# Patient Record
Sex: Female | Born: 1970 | Race: Black or African American | Hispanic: No
Health system: Southern US, Community
[De-identification: ages and names within clinical notes are randomized; demographics above are authoritative.]

## PROBLEM LIST (undated history)

## (undated) DIAGNOSIS — E785 Hyperlipidemia, unspecified: Secondary | ICD-10-CM

## (undated) DIAGNOSIS — Z8249 Family history of ischemic heart disease and other diseases of the circulatory system: Secondary | ICD-10-CM

## (undated) DIAGNOSIS — I10 Essential (primary) hypertension: Secondary | ICD-10-CM

## (undated) DIAGNOSIS — E7800 Pure hypercholesterolemia, unspecified: Secondary | ICD-10-CM

## (undated) DIAGNOSIS — G4733 Obstructive sleep apnea (adult) (pediatric): Secondary | ICD-10-CM

## (undated) HISTORY — DX: Pure hypercholesterolemia, unspecified: E78.00

## (undated) HISTORY — PX: CARPAL TUNNEL RELEASE: SHX101

## (undated) HISTORY — PX: OTHER SURGICAL HISTORY: SHX169

## (undated) HISTORY — DX: Hyperlipidemia, unspecified: E78.5

## (undated) HISTORY — DX: Obstructive sleep apnea (adult) (pediatric): G47.33

## (undated) HISTORY — DX: Essential (primary) hypertension: I10

## (undated) HISTORY — PX: ABDOMINAL HYSTERECTOMY: SHX81

## (undated) HISTORY — DX: Family history of ischemic heart disease and other diseases of the circulatory system: Z82.49

---

## 1997-10-22 ENCOUNTER — Encounter (INDEPENDENT_AMBULATORY_CARE_PROVIDER_SITE_OTHER): Payer: Self-pay | Admitting: *Deleted

## 1997-10-22 LAB — CONVERTED CEMR LAB

## 1999-10-09 ENCOUNTER — Encounter: Admission: RE | Admit: 1999-10-09 | Discharge: 1999-10-09 | Payer: Self-pay | Admitting: Family Medicine

## 1999-10-18 ENCOUNTER — Encounter: Admission: RE | Admit: 1999-10-18 | Discharge: 1999-10-18 | Payer: Self-pay | Admitting: Family Medicine

## 2000-03-06 ENCOUNTER — Encounter: Admission: RE | Admit: 2000-03-06 | Discharge: 2000-03-06 | Payer: Self-pay | Admitting: Family Medicine

## 2002-02-11 ENCOUNTER — Ambulatory Visit (HOSPITAL_COMMUNITY): Admission: RE | Admit: 2002-02-11 | Discharge: 2002-02-11 | Payer: Self-pay | Admitting: Family Medicine

## 2002-02-11 ENCOUNTER — Encounter: Payer: Self-pay | Admitting: Family Medicine

## 2002-02-16 ENCOUNTER — Encounter: Payer: Self-pay | Admitting: Family Medicine

## 2002-02-16 ENCOUNTER — Ambulatory Visit (HOSPITAL_COMMUNITY): Admission: RE | Admit: 2002-02-16 | Discharge: 2002-02-16 | Payer: Self-pay | Admitting: Family Medicine

## 2002-02-18 ENCOUNTER — Other Ambulatory Visit: Admission: RE | Admit: 2002-02-18 | Discharge: 2002-02-18 | Payer: Self-pay | Admitting: Obstetrics and Gynecology

## 2002-03-24 ENCOUNTER — Encounter: Admission: RE | Admit: 2002-03-24 | Discharge: 2002-03-24 | Payer: Self-pay | Admitting: Obstetrics and Gynecology

## 2002-03-24 ENCOUNTER — Encounter: Payer: Self-pay | Admitting: Obstetrics and Gynecology

## 2002-03-25 ENCOUNTER — Inpatient Hospital Stay (HOSPITAL_COMMUNITY): Admission: RE | Admit: 2002-03-25 | Discharge: 2002-03-27 | Payer: Self-pay | Admitting: Obstetrics and Gynecology

## 2003-04-02 ENCOUNTER — Other Ambulatory Visit: Admission: RE | Admit: 2003-04-02 | Discharge: 2003-04-02 | Payer: Self-pay | Admitting: Obstetrics and Gynecology

## 2003-04-27 ENCOUNTER — Ambulatory Visit (HOSPITAL_COMMUNITY): Admission: RE | Admit: 2003-04-27 | Discharge: 2003-04-27 | Payer: Self-pay | Admitting: Obstetrics and Gynecology

## 2003-09-23 ENCOUNTER — Other Ambulatory Visit: Admission: RE | Admit: 2003-09-23 | Discharge: 2003-09-23 | Payer: Self-pay | Admitting: Obstetrics and Gynecology

## 2003-12-22 ENCOUNTER — Ambulatory Visit (HOSPITAL_COMMUNITY): Admission: RE | Admit: 2003-12-22 | Discharge: 2003-12-22 | Payer: Self-pay | Admitting: Orthopedic Surgery

## 2004-04-10 ENCOUNTER — Other Ambulatory Visit: Admission: RE | Admit: 2004-04-10 | Discharge: 2004-04-10 | Payer: Self-pay | Admitting: Obstetrics and Gynecology

## 2005-12-04 ENCOUNTER — Other Ambulatory Visit: Admission: RE | Admit: 2005-12-04 | Discharge: 2005-12-04 | Payer: Self-pay | Admitting: Obstetrics and Gynecology

## 2006-11-26 ENCOUNTER — Other Ambulatory Visit: Admission: RE | Admit: 2006-11-26 | Discharge: 2006-11-26 | Payer: Self-pay | Admitting: Family Medicine

## 2006-12-20 ENCOUNTER — Encounter (INDEPENDENT_AMBULATORY_CARE_PROVIDER_SITE_OTHER): Payer: Self-pay | Admitting: *Deleted

## 2012-02-18 ENCOUNTER — Other Ambulatory Visit: Payer: Self-pay | Admitting: Obstetrics and Gynecology

## 2012-02-18 DIAGNOSIS — Z1231 Encounter for screening mammogram for malignant neoplasm of breast: Secondary | ICD-10-CM

## 2012-03-21 ENCOUNTER — Ambulatory Visit: Payer: Self-pay

## 2014-04-08 ENCOUNTER — Other Ambulatory Visit (HOSPITAL_COMMUNITY): Payer: Self-pay | Admitting: Obstetrics and Gynecology

## 2014-04-08 DIAGNOSIS — E041 Nontoxic single thyroid nodule: Secondary | ICD-10-CM

## 2014-04-13 ENCOUNTER — Ambulatory Visit (HOSPITAL_COMMUNITY): Payer: PRIVATE HEALTH INSURANCE

## 2015-04-11 ENCOUNTER — Other Ambulatory Visit: Payer: Self-pay | Admitting: Obstetrics and Gynecology

## 2015-04-12 LAB — CYTOLOGY - PAP

## 2019-06-09 ENCOUNTER — Other Ambulatory Visit: Payer: Self-pay | Admitting: Obstetrics and Gynecology

## 2019-06-09 DIAGNOSIS — Z9189 Other specified personal risk factors, not elsewhere classified: Secondary | ICD-10-CM

## 2019-07-11 ENCOUNTER — Other Ambulatory Visit: Payer: PRIVATE HEALTH INSURANCE

## 2021-05-22 ENCOUNTER — Other Ambulatory Visit: Payer: Self-pay | Admitting: Obstetrics and Gynecology

## 2021-05-22 DIAGNOSIS — R928 Other abnormal and inconclusive findings on diagnostic imaging of breast: Secondary | ICD-10-CM

## 2021-06-08 ENCOUNTER — Other Ambulatory Visit: Payer: Self-pay

## 2021-06-08 ENCOUNTER — Ambulatory Visit: Payer: PRIVATE HEALTH INSURANCE

## 2021-06-08 ENCOUNTER — Ambulatory Visit
Admission: RE | Admit: 2021-06-08 | Discharge: 2021-06-08 | Disposition: A | Discharge status: Home / Self Care | Payer: BLUE CROSS/BLUE SHIELD | Source: Ambulatory Visit | Attending: Obstetrics and Gynecology | Admitting: Obstetrics and Gynecology

## 2021-06-08 DIAGNOSIS — R928 Other abnormal and inconclusive findings on diagnostic imaging of breast: Secondary | ICD-10-CM

## 2021-09-27 ENCOUNTER — Other Ambulatory Visit: Payer: Self-pay | Admitting: Obstetrics and Gynecology

## 2021-09-27 DIAGNOSIS — Z139 Encounter for screening, unspecified: Secondary | ICD-10-CM

## 2021-10-25 ENCOUNTER — Other Ambulatory Visit: Payer: BLUE CROSS/BLUE SHIELD

## 2021-11-14 ENCOUNTER — Ambulatory Visit
Admission: RE | Admit: 2021-11-14 | Discharge: 2021-11-14 | Disposition: A | Discharge status: Home / Self Care | Payer: No Typology Code available for payment source | Source: Ambulatory Visit | Attending: Obstetrics and Gynecology | Admitting: Obstetrics and Gynecology

## 2021-11-14 DIAGNOSIS — Z139 Encounter for screening, unspecified: Secondary | ICD-10-CM

## 2021-11-17 ENCOUNTER — Telehealth: Payer: Self-pay

## 2021-11-17 NOTE — Telephone Encounter (Signed)
NOTES SCANNED TO REFERRAL 

## 2021-12-12 ENCOUNTER — Ambulatory Visit: Payer: BLUE CROSS/BLUE SHIELD | Admitting: Cardiovascular Disease

## 2022-10-16 NOTE — Progress Notes (Signed)
Cardiology Office Note:    Date:  01/02/2023   ID:  Debra Bridges, DOB 26-Aug-1971, MRN [id number]  PCP:  Arvella Nigh, MD   [facility] Providers Cardiologist:  Lenna Sciara, MD Referring MD: Arvella Nigh, MD   Chief Complaint/Reason for Referral: Elevated coronary calcium score  PATIENT DID NOT APPEAR FOR APPOINTMENT   ASSESSMENT:    1. Coronary artery calcification seen on CAT scan   2. Prediabetes   3. Hypertension associated with diabetes ([place])   4. Hyperlipidemia associated with type 2 diabetes mellitus ([place])   5. Body mass index (BMI) of 40.0 to 44.9 in adult [facility])     PLAN:    In order of problems listed above: 1.  Elevated coronary calcification: Start aspirin 81 mg and continue rosuvastatin. 2.  Prediabetes: Will check hemoglobin A1c today.  Start aspirin 81 mg, continue lisinopril and rosuvastatin.  If A1c is diagnostic for diabetes consider Jardiance. 3.  Hypertension: Continue lisinopril. 4.  Hyperlipidemia: Will check lipid panel, lipid, and LFTs today. 5.  Elevated BMI: Refer to pharmacy for recommendations.          History of Present Illness:    FOCUSED PROBLEM LIST:   1.  Elevated coronary artery calcium score on CT 2023 2.  Prediabetes on metformin 3.  Hypertension 4.  Hyperlipidemia 5.  BMI of 40 6.  CKD stage II  The patient is a 51 y.o. female with the indicated medical history here for recommendations regarding elevated calcium score on screening CT.          Current Medications: No outpatient medications have been marked as taking for the 10/17/22 encounter (Office Visit) with Early Osmond, MD.     Allergies:    Patient has no known allergies.   Social History:       Family Hx: Family History  Problem Relation Age of Onset   Hypertension Mother    Stroke Mother    Arthritis Mother    Atrial fibrillation Mother    Diabetes Mother    Cancer Paternal Grandmother        BREAST   Breast cancer Paternal  Grandmother      Review of Systems:   Please see the history of present illness.    All other systems reviewed and are negative.     EKGs/Labs/Other Test Reviewed:    EKG:  EKG performed today that I personally reviewed demonstrates  Prior CV studies:  Calcium score CT 2023: 1. Coronary calcium score is 4.43 and this is at percentile 85 for patients of the same age, gender and ethnicity. 2. Multiple small nodular lung densities, predominantly pleural based. No follow-up needed if patient is low-risk (and has no known or suspected primary neoplasm). Non-contrast chest CT can be considered in 12 months if patient is high-risk. This recommendation follows the consensus statement: Guidelines for Management of Incidental Pulmonary Nodules Detected on CT Images: From the Fleischner Society 2017; Radiology 2017; 284:228-243.  Other studies Reviewed: Review of the additional studies/records demonstrates: No other imaging evidence available of aortic atherosclerosis  Recent Labs: No results found for requested labs within last 365 days.   Recent Lipid Panel Lab Results  Component Value Date/Time   CHOL 163 10/31/2022 03:15 PM   TRIG 125 10/31/2022 03:15 PM   HDL 66 10/31/2022 03:15 PM   [place] 75 10/31/2022 03:15 PM    Risk Assessment/Calculations:     Physical Exam:      Signed, Early Osmond, MD  01/02/2023 2:29 PM    [facility] [place], [place], [place]  [address] Phone: [phone]; Fax: [phone]   Note:  This document was prepared using Dragon voice recognition software and may include unintentional dictation errors.

## 2022-10-17 ENCOUNTER — Ambulatory Visit: Payer: BLUE CROSS/BLUE SHIELD | Admitting: Internal Medicine

## 2022-10-31 ENCOUNTER — Ambulatory Visit: Payer: BC Managed Care – PPO | Attending: Internal Medicine | Admitting: Cardiology

## 2022-10-31 VITALS — BP 118/80 | HR 109 | Ht 64.0 in | Wt 239.6 lb

## 2022-10-31 DIAGNOSIS — I10 Essential (primary) hypertension: Secondary | ICD-10-CM

## 2022-10-31 DIAGNOSIS — R918 Other nonspecific abnormal finding of lung field: Secondary | ICD-10-CM | POA: Diagnosis not present

## 2022-10-31 DIAGNOSIS — R0683 Snoring: Secondary | ICD-10-CM

## 2022-10-31 DIAGNOSIS — E785 Hyperlipidemia, unspecified: Secondary | ICD-10-CM

## 2022-10-31 DIAGNOSIS — I2510 Atherosclerotic heart disease of native coronary artery without angina pectoris: Secondary | ICD-10-CM | POA: Diagnosis not present

## 2022-10-31 NOTE — Progress Notes (Signed)
Cardiology Office Note:    Date:  10/31/2022   ID:  Debra, Bridges 06-Nov-1970, MRN [id number]  PCP:  Arvella Nigh, MD  Cardiologist:  None  Electrophysiologist:  None   Referring MD: Arvella Nigh, MD   Chief Complaint  Patient presents with   Coronary Artery Disease    History of Present Illness:    Debra Bridges is a 52 y.o. female with a hx of hypertension, hyperlipidemia who is referred by Dr. Radene Knee for evaluation of elevated calcium score.  Calcium score 4 on 11/14/2021 (85th percentile).  Also with multiple small lung nodules.  She denies any chest pain, dyspnea, lightheadedness, syncope, lower extremity edema.  Reports palpitations rarely that lasts for few seconds. Walks 3 days per week for 15 minutes.  No exertional symptoms.  No smoking history.  Family history includes mother has A-fib.   Past Medical History:  Diagnosis Date   Elevated LDL cholesterol level    Family history of cardiovascular disease    Hyperlipidemia    Hypertension     Past Surgical History:  Procedure Laterality Date   ABDOMINAL HYSTERECTOMY     CARPAL TUNNEL RELEASE     EXCISION OF SEBACEOUS CYST      Current Medications: Current Meds  Medication Sig   betamethasone valerate (VALISONE) 0.1 % cream Apply 1 application topically daily. APPLY A THIN LAYER TO THE AFFECTED AREA ONCE DAILY   fluticasone (FLONASE) 50 MCG/ACT nasal spray Place into both nostrils daily.   hydrochlorothiazide (MICROZIDE) 12.5 MG capsule Take 12.5 mg by mouth daily.   lisinopril (ZESTRIL) 10 MG tablet Take 10 mg by mouth daily.   meloxicam (MOBIC) 7.5 MG tablet Take 1 tablet by mouth daily.   metFORMIN (GLUCOPHAGE) 500 MG tablet Take 1 tablet by mouth 2 (two) times daily with a meal.   Omega-3 Fatty Acids (FISH OIL PO) Take by mouth.   rosuvastatin (CRESTOR) 5 MG tablet      Allergies:   Patient has no known allergies.   Social History   Socioeconomic History   Marital status: Soil scientist     Spouse name: Not on file   Number of children: Not on file   Years of education: Not on file   Highest education level: Not on file  Occupational History   Not on file  Tobacco Use   Smoking status: Not on file   Smokeless tobacco: Not on file  Substance and Sexual Activity   Alcohol use: Not on file   Drug use: Not on file   Sexual activity: Not on file  Other Topics Concern   Not on file  Social History Narrative   Not on file   Social Determinants of Health   Financial Resource Strain: Not on file  Food Insecurity: Not on file  Transportation Needs: Not on file  Physical Activity: Not on file  Stress: Not on file  Social Connections: Not on file     Family History: The patient's family history includes Arthritis in her mother; Atrial fibrillation in her mother; Breast cancer in her paternal grandmother; Cancer in her paternal grandmother; Diabetes in her mother; Hypertension in her mother; Stroke in her mother.  ROS:   Please see the history of present illness.     All other systems reviewed and are negative.  EKGs/Labs/Other Studies Reviewed:    The following studies were reviewed today:   EKG:   10/31/2022: Sinus tachycardia, rate 109, no ST abnormalities  Recent Labs: No  results found for requested labs within last 365 days.  Recent Lipid Panel No results found for: "CHOL", "TRIG", "HDL", "CHOLHDL", "VLDL", "LDLCALC", "LDLDIRECT"  Physical Exam:    VS:  BP 118/80   Pulse (!) 109   Ht 5\' 4"  (1.626 m)   Wt 239 lb 9.6 oz (108.7 kg)   SpO2 99%   BMI 41.13 kg/m     Wt Readings from Last 3 Encounters:  10/31/22 239 lb 9.6 oz (108.7 kg)     GEN:  Well nourished, well developed in no acute distress HEENT: Normal NECK: No JVD; No carotid bruits LYMPHATICS: No lymphadenopathy CARDIAC: RRR, no murmurs, rubs, gallops RESPIRATORY:  Clear to auscultation without rales, wheezing or rhonchi  ABDOMEN: Soft, non-tender, non-distended MUSCULOSKELETAL:  No edema;  No deformity  SKIN: Warm and dry NEUROLOGIC:  Alert and oriented x 3 PSYCHIATRIC:  Normal affect   ASSESSMENT:    1. Coronary artery calcification seen on CAT scan   2. Essential hypertension   3. Hyperlipidemia, unspecified hyperlipidemia type   4. Lung nodules    PLAN:    CAD: Calcium score 4 on 11/14/2021 (85th percentile).  No anginal symptoms -Currently on rosuvastatin 5 mg daily.  Check lipid panel  Hypertension: On lisinopril 10 mg daily and HCTZ 12.5 mg daily.  Appears controlled  Small lung nodules: Noted on calcium score.  Patient is low risk (no smoking history or history of malignancy), no follow-up needed  Hyperlipidemia: LDL 132 on 10/24/2021.  Started on rosuvastatin 5 mg daily.  Unable to tolerate higher doses of statin due to myalgias.  Check lipid panel  T2DM: A1c 6.8% on 11/21/21.  On metformin.  RTC in 6 months   Medication Adjustments/Labs and Tests Ordered: Current medicines are reviewed at length with the patient today.  Concerns regarding medicines are outlined above.  No orders of the defined types were placed in this encounter.  No orders of the defined types were placed in this encounter.   There are no Patient Instructions on file for this visit.   Signed, Donato Heinz, MD  10/31/2022 2:52 PM    [facility]

## 2022-10-31 NOTE — Patient Instructions (Signed)
Medication Instructions:  Your physician recommends that you continue on your current medications as directed. Please refer to the Current Medication list given to you today.  *If you need a refill on your cardiac medications before your next appointment, please call your pharmacy*   Lab Work: Lipid today  If you have labs (blood work) drawn today and your tests are completely normal, you will receive your results only by: [place] (if you have MyChart) OR A paper copy in the mail If you have any lab test that is abnormal or we need to change your treatment, we will call you to review the results.   Testing/Procedures: WatchPAT?  Is a FDA cleared portable home sleep study test that uses a watch and 3 points of contact to monitor 7 different channels, including your heart rate, oxygen saturations, body position, snoring, and chest motion.  The study is easy to use from the comfort of your own home and accurately detect sleep apnea.  Before bed, you attach the chest sensor, attached the sleep apnea bracelet to your nondominant hand, and attach the finger probe.  After the study, the raw data is downloaded from the watch and scored for apnea events.   For more information: [email or web address]  Patient Testing Instructions:  Do not put battery into the device until bedtime when you are ready to begin the test. Please call the support number if you need assistance after following the instructions below: 24 hour support line- [phone] or ITAMAR support at [phone] (option 2)  Download the [employer] AmericanWatchPAT One" app through the google play store or App Store  Be sure to turn on or enable access to bluetooth in [place] on your smartphone/ device  Make sure no other bluetooth devices are on and within the vicinity of your smartphone/ device and WatchPAT watch during testing.  Make sure to leave your smart phone/ device plugged in and charging all night.  When  ready for bed:  Follow the instructions step by step in the WatchPAT One App to activate the testing device. For additional instructions, including video instruction, visit the WatchPAT One video on Youtube. You can search for [place] One within Youtube (video is 4 minutes and 18 seconds) or enter: https://youtube/watch?v=BCce_vbiwxE Please note: You will be prompted to enter a Pin to connect via bluetooth when starting the test. The PIN will be assigned to you when you receive the test.  The device is disposable, but it recommended that you retain the device until you receive a call letting you know the study has been received and the results have been interpreted.  We will let you know if the study did not transmit to [place] properly after the test is completed. You do not need to call us to confirm the receipt of the test.  Please complete the test within 48 hours of receiving PIN.   Frequently Asked Questions:  What is Watch [place] one?  A single use fully disposable home sleep apnea testing device and will not need to be returned after completion.  What are the requirements to use WatchPAT one?  The be able to have a successful watchpat one sleep study, you should have your Watch pat one device, your smart phone, watch pat one app, your PIN number and Internet access What type of phone do I need?  You should have a smart phone that uses Android 5.1 and above or any Iphone with IOS 10 and above How can I download the WatchPAT one app?  Based on your device type search for WatchPAT one app either in google play for android devices or APP store for Iphone's Where will I get my PIN for the study?  Your PIN will be provided by your physician's office. It is used for authentication and if you lose/forget your PIN, please reach out to your providers office.  I do not have Internet at home. Can I do WatchPAT one study?  WatchPAT One needs Internet connection throughout the night to be able to transmit the  sleep data. You can use your home/local internet or your cellular's data package. However, it is always recommended to use home/local Internet. It is estimated that between 20MB-30MB will be used with each study.However, the application will be looking for 80MB space in the phone to start the study.  What happens if I lose internet or bluetooth connection?  During the internet disconnection, your phone will not be able to transmit the sleep data. All the data, will be stored in your phone. As soon as the internet connection is back on, the phone will being sending the sleep data. During the bluetooth disconnection, WatchPAT one will not be able to to send the sleep data to your phone. Data will be kept in the [facility] one until two devices have bluetooth connection back on. As soon as the connection is back on, WatchPAT one will send the sleep data to the phone.  How long do I need to wear the WatchPAT one?  After you start the study, you should wear the device at least 6 hours.  How far should I keep my phone from the device?  During the night, your phone should be within 15 feet.  What happens if I leave the room for restroom or other reasons?  Leaving the room for any reason will not cause any problem. As soon as your get back to the room, both devices will reconnect and will continue to send the sleep data. Can I use my phone during the sleep study?  Yes, you can use your phone as usual during the study. But it is recommended to put your watchpat one on when you are ready to go to bed.  How will I get my study results?  A soon as you completed your study, your sleep data will be sent to the provider. They will then share the results with you when they are ready.    Follow-Up: At [facility], you and your health needs are our priority.  As part of our continuing mission to provide you with exceptional heart care, we have created designated Provider Care Teams.  These Care Teams include  your primary Cardiologist (physician) and Advanced Practice Providers (APPs -  Physician Assistants and Nurse Practitioners) who all work together to provide you with the care you need, when you need it.  We recommend signing up for the patient portal called "MyChart".  Sign up information is provided on this After Visit Summary.  MyChart is used to connect with patients for Virtual Visits (Telemedicine).  Patients are able to view lab/test results, encounter notes, upcoming appointments, etc.  Non-urgent messages can be sent to your provider as well.   To learn more about what you can do with MyChart, go to [email or web address].    Your next appointment:   6 month(s)  The format for your next appointment:   In Person  Provider:   Dr. Gardiner Rhyme

## 2022-11-01 LAB — LIPID PANEL
Chol/HDL Ratio: 2.5 ratio (ref 0.0–4.4)
Cholesterol, Total: 163 mg/dL (ref 100–199)
HDL: 66 mg/dL (ref >39)
LDL Chol Calc (NIH): 75 mg/dL (ref 0–99)
Triglycerides: 125 mg/dL (ref 0–149)
VLDL Cholesterol Cal: 22 mg/dL (ref 5–40)

## 2022-11-02 ENCOUNTER — Other Ambulatory Visit: Payer: Self-pay | Admitting: *Deleted

## 2022-11-02 MED ORDER — EZETIMIBE 10 MG PO TABS: 10.0000 mg | ORAL_TABLET | Freq: Every day | ORAL | 3 refills | Status: DC

## 2022-11-08 ENCOUNTER — Telehealth: Payer: Self-pay | Admitting: *Deleted

## 2022-11-08 ENCOUNTER — Encounter (INDEPENDENT_AMBULATORY_CARE_PROVIDER_SITE_OTHER): Payer: BC Managed Care – PPO | Admitting: Cardiology

## 2022-11-08 ENCOUNTER — Telehealth: Payer: Self-pay

## 2022-11-08 DIAGNOSIS — G4733 Obstructive sleep apnea (adult) (pediatric): Secondary | ICD-10-CM | POA: Diagnosis not present

## 2022-11-08 NOTE — Telephone Encounter (Signed)
Per Electa Sniff C at [facility] no PA is required for itamar. Stacy notified ok to have patient to activate the device.

## 2022-11-08 NOTE — Telephone Encounter (Signed)
Called and made the patient aware that SHE may proceed with the Itamar Home Sleep Study. PIN # provided to the patient. Patient made aware that SHE will be contacted after the test has been read with the results and any recommendations. Patient verbalized understanding and thanked me for the call.   

## 2022-11-09 NOTE — Progress Notes (Signed)
Patient came to office to return her itamar home sleep device. When she completed the study this morning, she stated the app told her that she needed to return device or print a label.  Informed her that we let her know if there are issues with how it transmitted to the cloud. She was appreciative for the update.

## 2022-11-11 NOTE — Procedures (Signed)
   Patient Information Study Date: 11/08/2022 Patient Name: Debra Bridges Patient ID: [id number] Birth Date: 1971-02-06 Age: 52 Gender: Female BMI: 41.0 (W=240 lb, H=5' 4'') Referring Physician: Oswaldo Milian, MD  TEST DESCRIPTION: Home sleep apnea testing was completed using the WatchPat, a Type 1 device, utilizing peripheral arterial tonometry (PAT), chest movement, actigraphy, pulse oximetry, pulse rate, body position and snore. AHI was calculated with apnea and hypopnea using valid sleep time as the denominator. RDI includes apneas, hypopneas, and RERAs. The data acquired and the scoring of sleep and all associated events were performed in accordance with the recommended standards and specifications as outlined in the AASM Manual for the Scoring of Sleep and Associated Events 2.2.0 (2015).   FINDINGS:   1. Mild Obstructive Sleep Apnea with AHI 8.4/hr.   2. No Central Sleep Apnea with pAHIc 0.4/hr.   3. Oxygen desaturations as low as 84%.   4. Mild snoring was present. O2 sats were < 88% for 0.3 min.   5. Total sleep time was 8 hrs and 20 min.   6. 23.3% of total sleep time was spent in REM sleep.   7. Normal sleep onset latency at 16 min.   8. Shortened REM sleep onset latency at 40 min.   9. Total awakenings were 10.  10. Arrhythmia detection:  None.  DIAGNOSIS: Mild Obstructive Sleep Apnea (G47.33)  RECOMMENDATIONS:   1.  Clinical correlation of these findings is necessary.  The decision to treat obstructive sleep apnea (OSA) is usually based on the presence of apnea symptoms or the presence of associated medical conditions such as Hypertension, Congestive Heart Failure, Atrial Fibrillation or Obesity.  The most common symptoms of OSA are snoring, gasping for breath while sleeping, daytime sleepiness and fatigue.   2.  Initiating apnea therapy is recommended given the presence of symptoms and/or associated conditions. Recommend proceeding with one of the following:      a.  Auto-CPAP therapy with a pressure range of 5-20cm H2O.     b.  An oral appliance (OA) that can be obtained from certain dentists with expertise in sleep medicine.  These are primarily of use in non-obese patients with mild and moderate disease.     c.  An ENT consultation which may be useful to look for specific causes of obstruction and possible treatment options.     d.  If patient is intolerant to PAP therapy, consider referral to ENT for evaluation for hypoglossal nerve stimulator.   3.  Close follow-up is necessary to ensure success with CPAP or oral appliance therapy for maximum benefit.  4.  A follow-up oximetry study on CPAP is recommended to assess the adequacy of therapy and determine the need for supplemental oxygen or the potential need for Bi-level therapy.  An arterial blood gas to determine the adequacy of baseline ventilation and oxygenation should also be considered.  5.  Healthy sleep recommendations include:  adequate nightly sleep (normal 7-9 hrs/night), avoidance of caffeine after noon and alcohol near bedtime, and maintaining a sleep environment that is cool, dark and quiet.  6.  Weight loss for overweight patients is recommended.  Even modest amounts of weight loss can significantly improve the severity of sleep apnea.  7.  Snoring recommendations include:  weight loss where appropriate, side sleeping, and avoidance of alcohol before bed.  8.  Operation of motor vehicle should be avoided when sleepy.  Signature: Fransico Him, MD; [facility]; [place], [place] Board of Sleep Medicine Electronically Signed: 11/12/2022

## 2022-11-12 ENCOUNTER — Ambulatory Visit: Payer: BC Managed Care – PPO | Attending: Cardiology

## 2022-11-12 DIAGNOSIS — R0683 Snoring: Secondary | ICD-10-CM

## 2022-11-15 ENCOUNTER — Telehealth: Payer: Self-pay | Admitting: *Deleted

## 2022-11-15 NOTE — Telephone Encounter (Signed)
-----  Message from Sueanne Margarita, MD sent at 11/11/2022  9:53 PM EST ----- Patient has very mild OSA - set up OV to discuss treatment options.

## 2022-11-15 NOTE — Telephone Encounter (Signed)
Patient notified of sleep study results and MD recommendations. She agrees to coming in for OV.to discuss. Message will be sent to [facility] for scheduling. Patient also wants to confirm that she is to take both the zetia and crestor. I told her that patients usually take them together, however I will defer this to Dr Gilman Schmidt because I don't know what his plan is for her and it is not noted in the chart. Message sent to Dr Gilman Schmidt for review.

## 2022-11-18 NOTE — Telephone Encounter (Signed)
Yes she is on rosuvastatin and Zetia for her hyperlipidemia

## 2022-11-19 NOTE — Telephone Encounter (Signed)
Spoke to patient, patient aware of recommendations.    Patient does voice concerns with sleep study process.  She reports doing a sleep study but has not been contacted about results.  She states she was called to schedule an appointment with Dr. Radford Pax and was told she must come to the appointment to get her results and recommendations.   She has appointment tomorrow but is concerned that the results were not discussed with her and the reasoning for the appointment (as sleep apnea is new to her and unfamiliar with this process).    Apologized for the frustrations and encouraged to keep appt tomorrow.   Will forward to clinical supervisor to make aware.

## 2022-11-20 ENCOUNTER — Ambulatory Visit: Payer: BC Managed Care – PPO | Attending: Cardiology | Admitting: Cardiology

## 2022-11-20 ENCOUNTER — Encounter: Payer: Self-pay | Admitting: Cardiology

## 2022-11-20 ENCOUNTER — Telehealth: Payer: Self-pay | Admitting: *Deleted

## 2022-11-20 VITALS — BP 116/70 | HR 88 | Ht 64.0 in | Wt 238.6 lb

## 2022-11-20 DIAGNOSIS — I10 Essential (primary) hypertension: Secondary | ICD-10-CM | POA: Diagnosis not present

## 2022-11-20 DIAGNOSIS — G4733 Obstructive sleep apnea (adult) (pediatric): Secondary | ICD-10-CM | POA: Diagnosis not present

## 2022-11-20 NOTE — Progress Notes (Signed)
Sleep Medicine CONSULT Note    Date:  11/20/2022   ID:  Debra Bridges, Debra Bridges 1971-09-02, MRN [id number]  PCP:  Arvella Nigh, MD  Cardiologist:Arun Ali Lowe, MD  No chief complaint on file.   History of Present Illness:  Debra Bridges is a 52 y.o. female who is being seen today for the evaluation of obstructive sleep apnea at the request of Oswaldo Milian MD.  This is a 52 year old female with a history of hyperlipidemia, hypertension and coronary artery calcifications.  She was seen by Dr. Gilman Schmidt on 10/31/2022 at which time he ordered a home sleep study.  Sleep study showed very mild obstructive sleep apnea with an AHI of 8.4/h overall but moderate during REM sleep at 17/hr.  There was no evidence of nocturnal hypoxemia.  She is now referred to sleep medicine for evaluation of very mild street apnea and discussion of treatment options.  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  She has been told that she snores but has never awakened from snoring or gasping for breath.  She has no morning HAs.  Her Stop Bang Score is 5.  On average she goes to bed a 9pm and gets up at 5:30am to take her BP meds and then falls back asleep until 7am.  She wakes up at least twice nightly to use the bathroom.  She feels rested when she wakes up in the am but by 5pm starts to feel sleepy.   Past Medical History:  Diagnosis Date   Elevated LDL cholesterol level    Family history of cardiovascular disease    Hyperlipidemia    Hypertension    OSA (obstructive sleep apnea)    very mild obstructive sleep apnea with an AHI of 8.4/h and no evidence of nocturnal hypoxemia.    Past Surgical History:  Procedure Laterality Date   ABDOMINAL HYSTERECTOMY     CARPAL TUNNEL RELEASE     EXCISION OF SEBACEOUS CYST      Current Medications: No outpatient medications have been marked as taking for the 11/20/22  encounter (Office Visit) with Sueanne Margarita, MD.    Allergies:   Patient has no known allergies.   Social History   Socioeconomic History   Marital status: Soil scientist    Spouse name: Not on file   Number of children: Not on file   Years of education: Not on file   Highest education level: Not on file  Occupational History   Not on file  Tobacco Use   Smoking status: Not on file   Smokeless tobacco: Not on file  Substance and Sexual Activity   Alcohol use: Not on file   Drug use: Not on file   Sexual activity: Not on file  Other Topics Concern   Not on file  Social History Narrative   Not on file   Social Determinants of Health   Financial Resource Strain: Not on file  Food Insecurity: Not on file  Transportation Needs: Not on file  Physical Activity: Not on file  Stress: Not on file  Social Connections: Not on file     Family History:  The patient's family history includes Arthritis in her mother; Atrial fibrillation in her mother; Breast cancer in her paternal grandmother; Cancer in her paternal grandmother; Diabetes in her mother; Hypertension in her mother; Stroke in her mother.  ROS:   Please see the history of present illness.    ROS All other systems reviewed and are negative.      No data to display             PHYSICAL EXAM:   VS:  BP 116/70   Pulse 88   Ht 5\' 4"  (1.626 m)   Wt 238 lb 9.6 oz (108.2 kg)   SpO2 97%   BMI 40.96 kg/m    GEN: Well nourished, well developed, in no acute distress  HEENT: normal  Neck: no JVD, carotid bruits, or masses Cardiac: RRR; no murmurs, rubs, or gallops,no edema.  Intact distal pulses bilaterally.  Respiratory:  clear to auscultation bilaterally, normal work of breathing GI: soft, nontender, nondistended, + BS MS: no deformity or atrophy  Skin: warm and dry, no rash Neuro:  Alert and Oriented x 3, Strength and sensation are intact Psych: euthymic mood, full affect  Wt Readings from Last 3  Encounters:  11/20/22 238 lb 9.6 oz (108.2 kg)  10/31/22 239 lb 9.6 oz (108.7 kg)      Studies/Labs Reviewed:   Home sleep study  Recent Labs: No results found for requested labs within last 365 days.    Additional studies/ records that were reviewed today include:  None    ASSESSMENT:    1. OSA (obstructive sleep apnea)   2. Essential hypertension      PLAN:  In order of problems listed above:  OSA  -Home sleep study demonstrated very mild obstructive sleep apnea with an AHI of 8.4/h and no evidence of nocturnal hypoxemia but does have moderate OSA during REM sleep -I discussed the treatment options with her including avoiding sleeping supine since most of her events are in the supine position, a trial of CPAP therapy to see if she sleeps better at night with less daytime sleepiness or a dental device -dental device is cost prohibitive -she is concerned that she has not met her deductible yet and may not be able to afford it but would like to try the CPAP -will order a ResMed auto CPAP from 4 to 12cm H2O with heated humidity and nasal pillow mask -if she finds out the CPAP is too expensive due to not meeting deductible she will let me know and we can revisit it after she reaches her deductible limit later this year and in the mean time purchase a sleep pillow to avoid sleeping supine   2.  Hypertension -BP controlled on exam -Continue prescription drug management with lisinopril 10 mg daily and HCTZ 12.5 mg daily with as needed refills  Time Spent: 15 minutes total time of encounter, including 15 minutes spent in face-to-face patient care on the date of this encounter. This time includes coordination of care and counseling regarding above mentioned problem list. Remainder of non-face-to-face time involved reviewing chart documents/testing relevant to the patient encounter and documentation in the medical record. I have independently reviewed documentation from referring  provider  Medication Adjustments/Labs and Tests Ordered: Current medicines are reviewed at length with the patient today.  Concerns regarding medicines are outlined above.  Medication changes, Labs and Tests ordered today are listed in the Patient Instructions below.  There are no Patient Instructions on file for this visit.   Signed, Fransico Him, MD  11/20/2022 11:04 AM    [address] [place], [place], [place]  [address] Phone: [phone]; Fax: [phone]

## 2022-11-20 NOTE — Telephone Encounter (Signed)
DME selection is [facility] Patient understands he will be contacted by [facility] to set up his cpap. Patient understands to call if [facility] does not contact him with new setup in a timely manner. Patient understands they will be called once confirmation has been received from [facility] that they have received their new machine to schedule 10 week follow up appointment.   [facility] notified of new cpap order  Please add to airview Patient was grateful for the call and thanked me.    

## 2022-11-20 NOTE — Telephone Encounter (Signed)
Per Dr Radford Pax, order a ResMed auto CPAP from 4 to 12cm H2O with heated humidity and nasal pillow mask

## 2022-11-20 NOTE — Patient Instructions (Signed)
Medication Instructions:  Your physician recommends that you continue on your current medications as directed. Please refer to the Current Medication list given to you today.  *If you need a refill on your cardiac medications before your next appointment, please call your pharmacy*  Follow-Up: At [facility], you and your health needs are our priority.  As part of our continuing mission to provide you with exceptional heart care, we have created designated Provider Care Teams.  These Care Teams include your primary Cardiologist (physician) and Advanced Practice Providers (APPs -  Physician Assistants and Nurse Practitioners) who all work together to provide you with the care you need, when you need it.   Your next appointment:   We will contact you for follow up after you receive your CPAP machine

## 2022-11-20 NOTE — Telephone Encounter (Signed)
Patient called back to say she was going to opt out and not get the cpap and just use pillows behind her back for now. Cpap order was cancelled with [facility] Seth Bake)

## 2023-09-24 IMAGING — CT CT CARDIAC CORONARY ARTERY CALCIUM SCORE
3 series · 14 of 20 positions shown, 16 images · non-contrast
Comparison: None.

CLINICAL DATA: 50-year-old African American female with family
history of coronary artery disease. Encounter for screening.

EXAM:
CT CARDIAC CORONARY ARTERY CALCIUM SCORE
TECHNIQUE: Non-contrast imaging through the heart was performed using
prospective ECG gating. Image post processing was performed on an
independent workstation, allowing for quantitative analysis of the
heart and coronary arteries. Note that this exam targets the heart
and the chest was not imaged in its entirety.

[Series 2: calcium scoring 2.00 qr36 bestdiast 71% hrt calciu · axial · 0.43mm/px · z∈[+1497,+1605]mm · 4 of 90 slices shown]
[im 18/90  vessel]
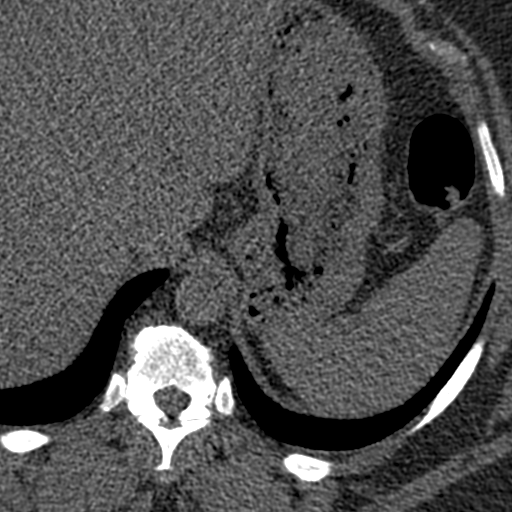
[im 36/90  vessel]
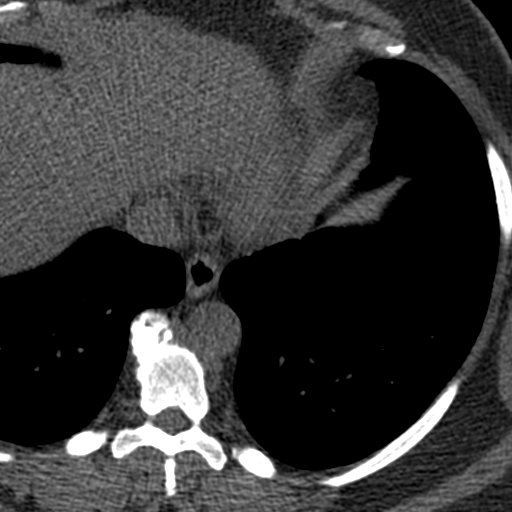
[im 54/90  vessel]
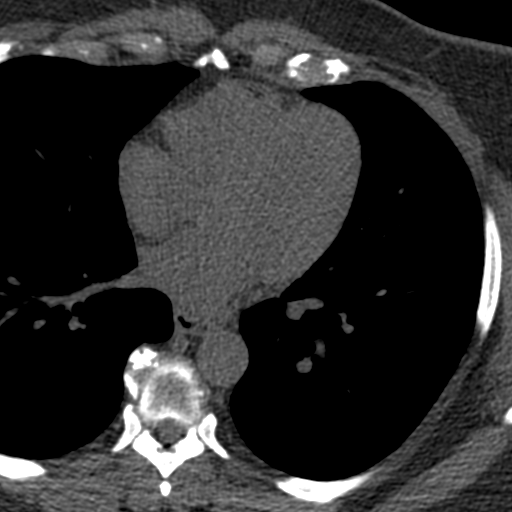
[im 72/90  vessel]
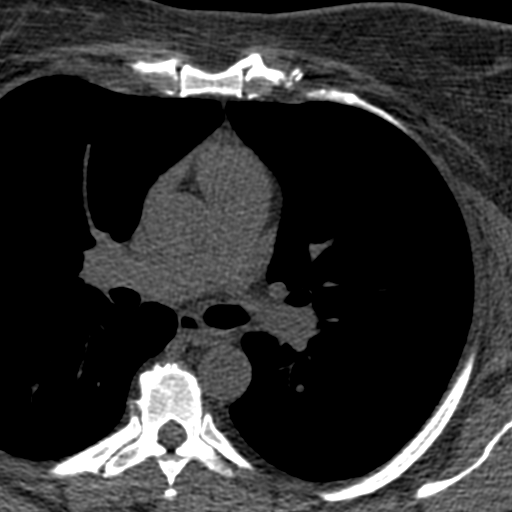

[Series 3: calcium scoring 2.00 br40 bestdiast 71% axial · axial · 0.75mm/px · z∈[+1491,+1611]mm · 5 of 90 slices shown, 7 images]
[im 15/90  vessel]
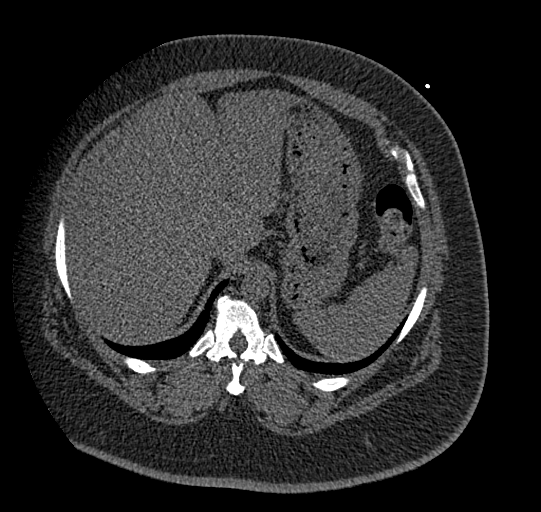
[im 15/90  lung]
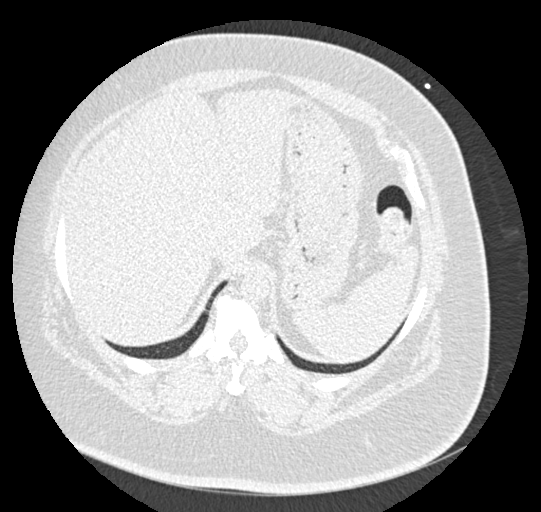
[im 30/90  vessel]
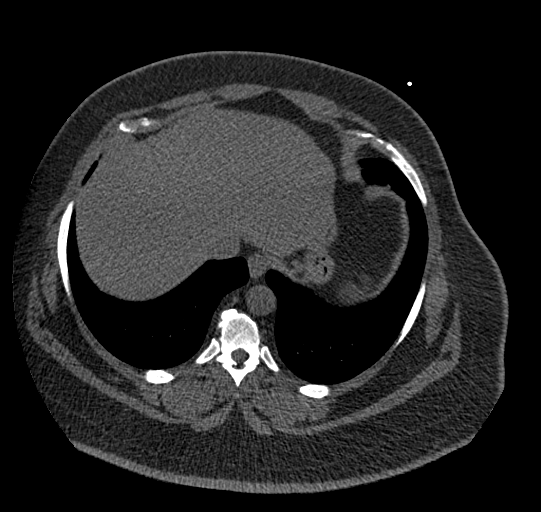
[im 45/90  vessel]
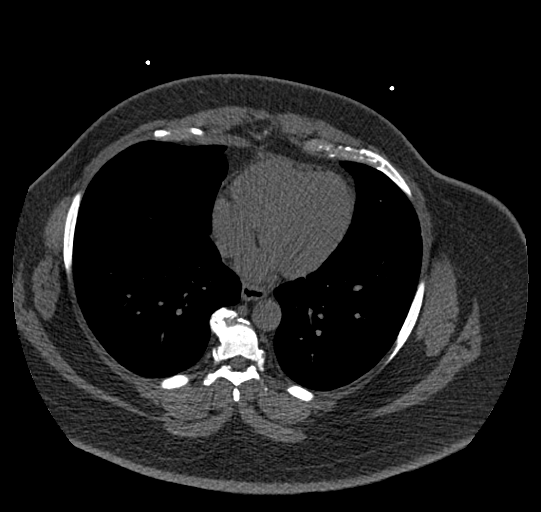
[im 60/90  vessel]
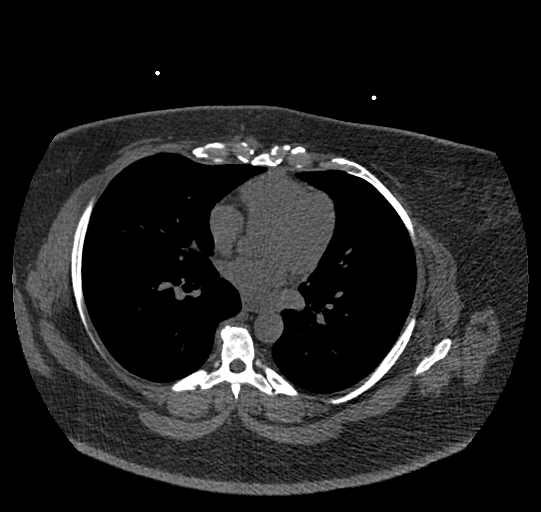
[im 75/90  vessel]
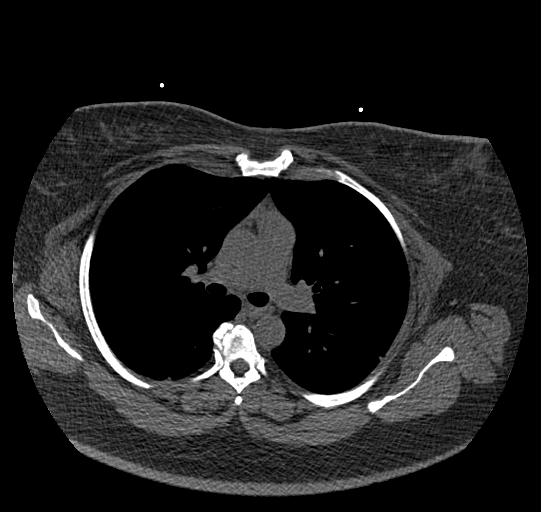
[im 75/90  lung]
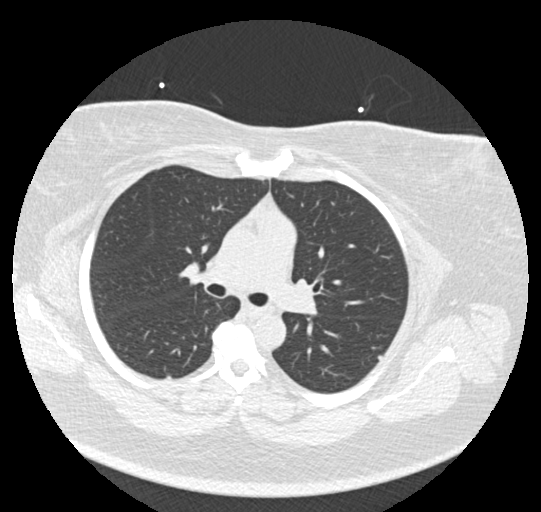

[Series 9: calcium scoring 2.00 br60 bestdiast 71% lungs · axial · 0.73mm/px · z∈[+1491,+1611]mm · 5 of 90 slices shown]
[im 15/90  vessel]
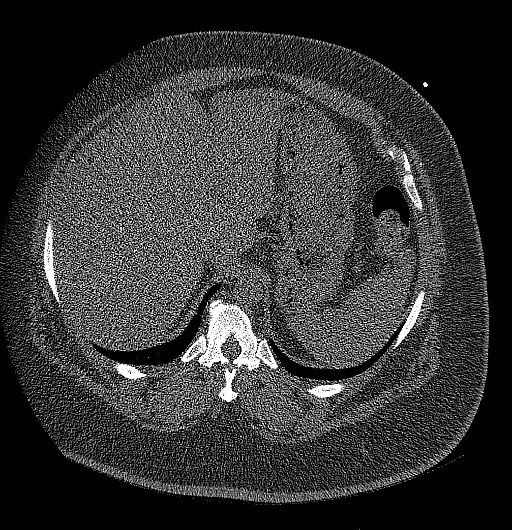
[im 30/90  vessel]
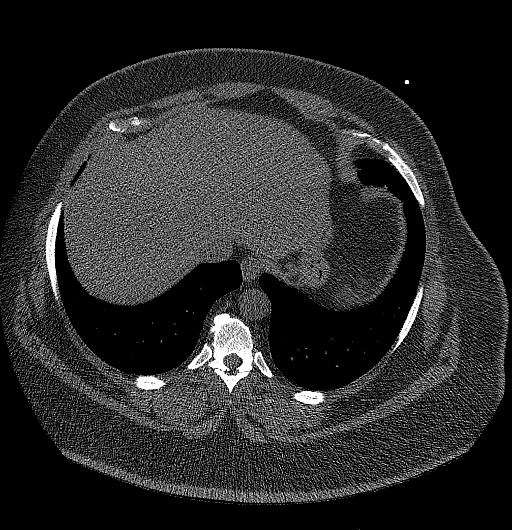
[im 45/90  vessel]
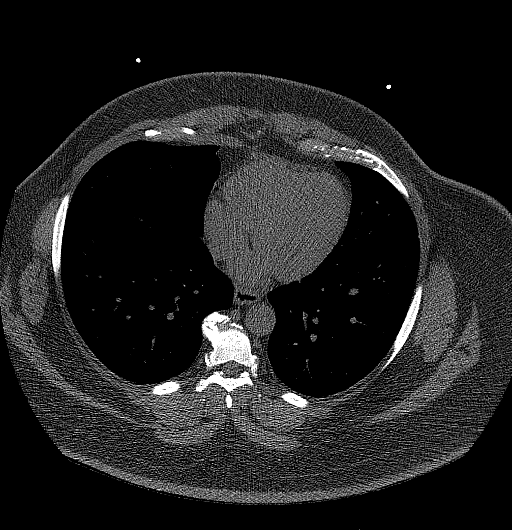
[im 60/90  vessel]
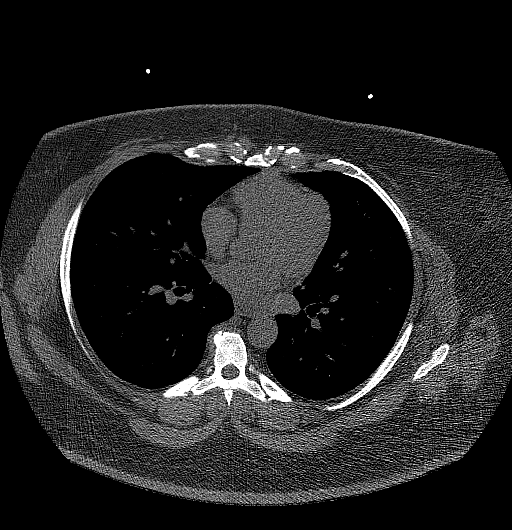
[im 75/90  vessel]
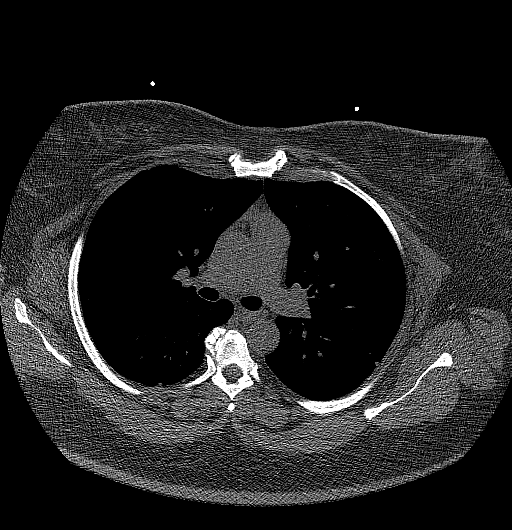

[14 of 20 positions shown; findings below may reference images not displayed]

FINDINGS: CORONARY CALCIUM SCORES:

Left Main: 0

LAD:

LCx: 0

RCA: 0

Total Agatston Score:

[HOSPITAL] percentile: 85

AORTA MEASUREMENTS:

Ascending Aorta: 26 mm

Descending Aorta: 23 mm

OTHER FINDINGS:

Heart size is normal. No significant pericardial effusion.
Visualized mediastinal structures are normal. Images of the upper
abdomen are unremarkable. Focal thickening or nodularity along the
right minor fissure measures 5 mm on sequence 9, image 18.
Additional small pleural-based nodular densities in both lungs. 3 mm
pleural-based nodule density in left lower lobe on sequence 9 image
16. No evidence for airspace disease or lung consolidation in the
visualized lungs. No acute bone abnormality.
IMPRESSION: 1. Coronary calcium score is 4.43 and this is at percentile 85 for
patients of the same age, gender and ethnicity.
2. Multiple small nodular lung densities, predominantly pleural
based. No follow-up needed if patient is low-risk (and has no known
or suspected primary neoplasm). Non-contrast chest CT can be
considered in 12 months if patient is high-risk. This recommendation
follows the consensus statement: Guidelines for Management of
Incidental Pulmonary Nodules Detected on CT Images: From the

## 2023-11-04 ENCOUNTER — Other Ambulatory Visit: Payer: Self-pay | Admitting: Cardiology

## 2023-12-18 ENCOUNTER — Inpatient Hospital Stay: Payer: Managed Care, Other (non HMO) | Attending: Hematology and Oncology

## 2023-12-18 ENCOUNTER — Inpatient Hospital Stay (HOSPITAL_BASED_OUTPATIENT_CLINIC_OR_DEPARTMENT_OTHER): Payer: Managed Care, Other (non HMO) | Admitting: Hematology and Oncology

## 2023-12-18 VITALS — BP 130/83 | HR 92 | Temp 97.6°F | Resp 16 | Wt 234.0 lb

## 2023-12-18 DIAGNOSIS — D500 Iron deficiency anemia secondary to blood loss (chronic): Secondary | ICD-10-CM

## 2023-12-18 DIAGNOSIS — D509 Iron deficiency anemia, unspecified: Secondary | ICD-10-CM | POA: Insufficient documentation

## 2023-12-18 DIAGNOSIS — R718 Other abnormality of red blood cells: Secondary | ICD-10-CM

## 2023-12-18 LAB — CBC WITH DIFFERENTIAL (CANCER CENTER ONLY)
Abs Immature Granulocytes: 0.01 10*3/uL (ref 0.00–0.07)
Basophils Absolute: 0.1 10*3/uL (ref 0.0–0.1)
Basophils Relative: 1 %
Eosinophils Absolute: 0.1 10*3/uL (ref 0.0–0.5)
Eosinophils Relative: 2 %
HCT: 42.9 % (ref 36.0–46.0)
Hemoglobin: 13.1 g/dL (ref 12.0–15.0)
Immature Granulocytes: 0 %
Lymphocytes Relative: 32 %
Lymphs Abs: 2.3 10*3/uL (ref 0.7–4.0)
MCH: 23.6 pg — ABNORMAL LOW (ref 26.0–34.0)
MCHC: 30.5 g/dL (ref 30.0–36.0)
MCV: 77.4 fL — ABNORMAL LOW (ref 80.0–100.0)
Monocytes Absolute: 0.5 10*3/uL (ref 0.1–1.0)
Monocytes Relative: 7 %
Neutro Abs: 4.2 10*3/uL (ref 1.7–7.7)
Neutrophils Relative %: 58 %
Platelet Count: 265 10*3/uL (ref 150–400)
RBC: 5.54 MIL/uL — ABNORMAL HIGH (ref 3.87–5.11)
RDW: 20.2 % — ABNORMAL HIGH (ref 11.5–15.5)
WBC Count: 7.1 10*3/uL (ref 4.0–10.5)
nRBC: 0 % (ref 0.0–0.2)

## 2023-12-18 LAB — RETIC PANEL
Immature Retic Fract: 11.1 % (ref 2.3–15.9)
RBC.: 5.56 MIL/uL — ABNORMAL HIGH (ref 3.87–5.11)
Retic Count, Absolute: 70.6 10*3/uL (ref 19.0–186.0)
Retic Ct Pct: 1.3 % (ref 0.4–3.1)
Reticulocyte Hemoglobin: 27.6 pg — ABNORMAL LOW (ref >27.9)

## 2023-12-18 LAB — CMP (CANCER CENTER ONLY)
ALT: 21 U/L (ref 0–44)
AST: 15 U/L (ref 15–41)
Albumin: 4 g/dL (ref 3.5–5.0)
Alkaline Phosphatase: 79 U/L (ref 38–126)
Anion gap: 7 (ref 5–15)
BUN: 14 mg/dL (ref 6–20)
CO2: 29 mmol/L (ref 22–32)
Calcium: 9.8 mg/dL (ref 8.9–10.3)
Chloride: 104 mmol/L (ref 98–111)
Creatinine: 0.8 mg/dL (ref 0.44–1.00)
GFR, Estimated: >60 mL/min (ref >60)
Glucose, Bld: 91 mg/dL (ref 70–99)
Potassium: 4 mmol/L (ref 3.5–5.1)
Sodium: 140 mmol/L (ref 135–145)
Total Bilirubin: 0.3 mg/dL (ref 0.0–1.2)
Total Protein: 7 g/dL (ref 6.5–8.1)

## 2023-12-18 LAB — IRON AND IRON BINDING CAPACITY (CC-WL,HP ONLY)
Iron: 62 ug/dL (ref 28–170)
Saturation Ratios: 14 % (ref 10.4–31.8)
TIBC: 456 ug/dL — ABNORMAL HIGH (ref 250–450)
UIBC: 394 ug/dL (ref 148–442)

## 2023-12-18 LAB — FERRITIN: Ferritin: 12 ng/mL (ref 11–307)

## 2023-12-18 NOTE — Progress Notes (Signed)
 [facility] [phone]   [phone]  INITIAL CONSULT NOTE  Patient Care Team: Richardean Chimera, MD as PCP - General (Obstetrics and Gynecology)  Hematological/Oncological History # Microcytosis # Microcytic Anemia  11/15/2023: WBC 7.4, Hgb 13.1, MCV 75, Plt 354, ferritin 7, iron sat 6%, TIBC 483  12/18/2023: establish care with Dr. Leonides Schanz   CHIEF COMPLAINTS/PURPOSE OF CONSULTATION:  "Microcytic Anemia "  HISTORY OF PRESENTING ILLNESS:  Debra Bridges 53 y.o. female with medical history significant for LDL, hypertension, OSA, who presents for evaluation of microcytosis with occasional microcytic anemia.   On review of the previous records Ms. Demore had labs drawn on 11/15/2023 which showed WBC 7.4, Hgb 13.1, MCV 75, Plt 354, ferritin 7, iron sat 6%, TIBC 483.  Due to concern for her iron deficiency without anemia she was referred to hematology for further evaluation and management.  On exam today Ms. Lahey reports that she does like to donate blood.  She reports she has blood least 4 times per year.  She reports that she last gave in January 2025.  She reports that she has been taking p.o. iron therapy twice daily 65 mg.  She reports she is tolerating it well with no constipation but she is taking a stool softener.  She reports that she did have a hysterectomy at age 40 and does not have any overt signs of bleeding such as GYN bleeding, GI bleeding, or dark stools.  She reports that she does eat red meat but not often.  She notes that she does prefer chicken, fish and [place].  She reports that she is otherwise feeling well.  On further discussion she reports that her sister had multiple myeloma and is treated in [place].  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  Her father passed away in 01-12-2013 due to a blocked bowel.  She has no children and is single.  She is a never smoker but does drink beer on occasion.  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  She reports that she did have a colonoscopy in 01-13-19 and was told she was good for 10 years.  She otherwise denies any fevers, chills, sweats, nausea, vomiting or diarrhea.  Full 10 point ROS is otherwise negative.  MEDICAL HISTORY:  Past Medical History:  Diagnosis Date   Elevated LDL cholesterol level    Family history of cardiovascular disease    Hyperlipidemia    Hypertension    OSA (obstructive sleep apnea)    very mild obstructive sleep apnea with an AHI of 8.4/h and no evidence of nocturnal hypoxemia.    SURGICAL HISTORY: Past Surgical History:  Procedure Laterality Date   ABDOMINAL HYSTERECTOMY     CARPAL TUNNEL RELEASE     EXCISION OF SEBACEOUS CYST      SOCIAL HISTORY: Social History   Socioeconomic History   Marital status: Media planner    Spouse name: Not on file   Number of children: Not on file   Years of education: Not on file   Highest education level: Not on file  Occupational History   Not on file  Tobacco Use   Smoking status: Not on file   Smokeless tobacco: Not on file  Substance and Sexual Activity   Alcohol use: Not on file   Drug use: Not on file   Sexual activity: Not on file  Other Topics Concern   Not on file  Social History Narrative   Not on file   Social Drivers of Health   Financial Resource Strain: Not on file  Food Insecurity: Not on file  Transportation Needs: Not on file  Physical Activity: Not on file  Stress: Not on file  Social Connections: Not on file  Intimate Partner Violence: Not on file    FAMILY HISTORY: Family History  Problem Relation Age of Onset   Hypertension Mother    Stroke Mother    Arthritis Mother    Atrial fibrillation Mother    Diabetes Mother    Cancer Paternal Grandmother        BREAST   Breast cancer Paternal Grandmother     ALLERGIES:  has no known allergies.  MEDICATIONS:  Current Outpatient Medications  Medication Sig  Dispense Refill   betamethasone valerate (VALISONE) 0.1 % cream Apply 1 application topically daily. APPLY A THIN LAYER TO THE AFFECTED AREA ONCE DAILY     ezetimibe (ZETIA) 10 MG tablet Take 1 tablet by mouth once daily 90 tablet 0   fluticasone (FLONASE) 50 MCG/ACT nasal spray Place into both nostrils daily.     hydrochlorothiazide (MICROZIDE) 12.5 MG capsule Take 12.5 mg by mouth daily.     lisinopril (ZESTRIL) 10 MG tablet Take 10 mg by mouth daily.     meloxicam (MOBIC) 7.5 MG tablet Take 1 tablet by mouth daily.     metFORMIN (GLUCOPHAGE) 500 MG tablet Take 1 tablet by mouth 2 (two) times daily with a meal.     Omega-3 Fatty Acids (FISH OIL PO) Take by mouth.     rosuvastatin (CRESTOR) 5 MG tablet      No current facility-administered medications for this visit.    REVIEW OF SYSTEMS:   Constitutional: ( - ) fevers, ( - )  chills , ( - ) night sweats Eyes: ( - ) blurriness of vision, ( - ) double vision, ( - ) watery eyes Ears, nose, mouth, throat, and face: ( - ) mucositis, ( - ) sore throat Respiratory: ( - ) cough, ( - ) dyspnea, ( - ) wheezes Cardiovascular: ( - ) palpitation, ( - ) chest discomfort, ( - ) lower extremity swelling Gastrointestinal:  ( - ) nausea, ( - ) heartburn, ( - ) change in bowel habits Skin: ( - ) abnormal skin rashes Lymphatics: ( - ) new lymphadenopathy, ( - ) easy bruising Neurological: ( - ) numbness, ( - ) tingling, ( - ) new weaknesses Behavioral/Psych: ( - ) mood change, ( - ) new changes  All other systems were reviewed with the patient and are negative.  PHYSICAL EXAMINATION:  There were no vitals filed for this visit. There were no vitals filed for this visit.  GENERAL: well appearing middle-aged African-American female in NAD  SKIN: skin color, texture, turgor are normal, no rashes or significant lesions EYES: conjunctiva are pink and non-injected, sclera clear LUNGS: clear to auscultation and percussion with normal breathing  effort HEART: regular rate & rhythm and no murmurs and no lower extremity edema Musculoskeletal: no cyanosis of digits and no clubbing  PSYCH: alert & oriented x 3, fluent speech NEURO: no focal motor/sensory deficits  LABORATORY DATA:  I have reviewed the data as listed     No data to display              No data to display           ASSESSMENT & PLAN Debra Bridges 53 y.o.  female with medical history significant for LDL, hypertension, OSA, who presents for evaluation of microcytosis with occasional microcytic anemia.   After review of the labs, review of the records, and discussion with the patient the patients findings are most consistent with iron deficiency without anemia.  On prior labs the patient had a ferritin of 7 with iron sat of 6%.  Given these findings the patient likely has adequate enough iron to maintain her hemoglobin, but has deficient stores.   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 --Patient donates blood approximately 4 times per year.  This is certainly contributing to her iron deficiency. --We will confirm iron deficiency anemia by ordering iron panel and ferritin as well as reticulocytes, CBC, and CMP --Continue ferrous sulfate 325 mg daily with a source of vitamin C --We will plan to proceed with IV iron therapy in order to help bolster the patient's blood counts if she is found to have persistently low levels today not responding to p.o. therapy. --If microcytosis does not resolve with adequate iron repletion would consider testing for thalassemia --Additionally if the iron deficiency does resolve with therapy would recommend consideration of GI evaluation.  She had a colonoscopy in 2020 but EGD could be considered if iron deficiency is persistent. --Plan for return to clinic pending the results of the above studies.   No orders of  the defined types were placed in this encounter.   All questions were answered. The patient knows to call the clinic with any problems, questions or concerns.  A total of more than 60 minutes were spent on this encounter with face-to-face time and non-face-to-face time, including preparing to see the patient, ordering tests and/or medications, counseling the patient and coordination of care as outlined above.   Ulysees Barns, MD Department of Hematology/Oncology [facility] Phone: [phone] Pager: [phone] Email: [email or web address]  12/18/2023 9:10 AM

## 2024-01-06 ENCOUNTER — Other Ambulatory Visit: Payer: Self-pay | Admitting: Hematology and Oncology

## 2024-01-06 ENCOUNTER — Telehealth: Payer: Self-pay | Admitting: *Deleted

## 2024-01-06 DIAGNOSIS — D509 Iron deficiency anemia, unspecified: Secondary | ICD-10-CM | POA: Insufficient documentation

## 2024-01-06 DIAGNOSIS — D500 Iron deficiency anemia secondary to blood loss (chronic): Secondary | ICD-10-CM

## 2024-01-06 NOTE — Telephone Encounter (Signed)
 Received call from pt inquiring about her lab results from 12/18/23. Spoke with Dr. Suzan Slick reviewed the results and recommends IV iron. Pt made aware of this plan and is agreeable. Advised that she should hear from [place] within a couple of days to get those appts scheduled. Pt voiced understanding.

## 2024-01-19 ENCOUNTER — Other Ambulatory Visit: Payer: Self-pay | Admitting: Cardiology

## 2024-01-23 ENCOUNTER — Other Ambulatory Visit (HOSPITAL_BASED_OUTPATIENT_CLINIC_OR_DEPARTMENT_OTHER): Payer: Self-pay | Admitting: Cardiology

## 2024-01-24 ENCOUNTER — Telehealth: Payer: Self-pay | Admitting: Cardiology

## 2024-01-24 NOTE — Telephone Encounter (Signed)
*  STAT* If patient is at the pharmacy, call can be transferred to refill team.   1. Which medications need to be refilled? (please list name of each medication and dose if known) ezetimibe (ZETIA) 10 MG tablet    2. Would you like to learn more about the convenience, safety, & potential cost savings by using the [facility]? No   3. Are you open to using the [facility] (Type [facility]. ). No   4. Which pharmacy/location (including street and city if local pharmacy) is medication to be sent to? [facility] - [place] ([place]), [place] - [address]    5. Do they need a 30 day or 90 day supply? 90 day   Pt out of this medication

## 2024-01-24 NOTE — Telephone Encounter (Signed)
 Called patient left message on personal voice mail to call back to schedule follow up appointment with Dr.Schumann.

## 2024-01-27 MED ORDER — EZETIMIBE 10 MG PO TABS: 10.0000 mg | ORAL_TABLET | Freq: Every day | ORAL | 0 refills | Status: DC

## 2024-01-27 MED ORDER — EZETIMIBE 10 MG PO TABS: 10.0000 mg | ORAL_TABLET | Freq: Every day | ORAL | 1 refills | Status: DC

## 2024-01-27 NOTE — Telephone Encounter (Signed)
 Pt's medication was sent to pt's pharmacy as requested. Confirmation received.

## 2024-01-28 ENCOUNTER — Telehealth: Payer: Self-pay

## 2024-01-28 NOTE — Telephone Encounter (Signed)
 Our scheduling nurses called and spoke with Debra Bridges. She does not want to schedule until she speaks with Dr. Leonides Schanz or the nurse. She said no one has contacted regarding her labs or an explanation as to why her Iron levels are low. Please advise [place] when to schedule the Debra Bridges. Thank you!

## 2024-01-28 NOTE — Telephone Encounter (Signed)
 Dr. Leonides Schanz, patient will be scheduled as soon as possible.  Auth Submission: APPROVED Site of care: Site of care: [facility] Payer: [employer] commercial Medication & CPT/J Code(s) submitted: Feraheme (ferumoxytol) [id number] Route of submission (phone, fax, portal): fax Phone # Fax # [phone]  Auth type: Buy/Bill PB Units/visits requested: 510mg  x 2 doses Reference number: [id number] Approval from: 01/27/24 to 02/25/24   Please note short approval dates.

## 2024-01-29 ENCOUNTER — Telehealth: Payer: Self-pay | Admitting: Cardiology

## 2024-01-29 ENCOUNTER — Telehealth: Payer: Self-pay | Admitting: *Deleted

## 2024-01-29 NOTE — Telephone Encounter (Signed)
 TCT patient regarding Dr. Derek Mound recommendation of IV iron as pt's iron stores are low. Spoke with her. She is asking about why her iron levels are low. Advised that the working Nelva Bush is that her iron levels are low potentially due to the blood donations she has done over the last year or more.  Explained what low iron stores mean and that the best way to replete these stores is through IV iron. Pt asked if she continued her oral iron and ate more iron rich foods-would that be enough. Advised that it would take a very long time to really fill up her "iron tank" this way. She would have some iron available but not enough to sustain the making on her red blood cells long term.  It would take months to build up her iron stores through food/oral iron. Advised that we would also know that if she stops donating blood and gets the IV iron and her iron stores remain on the low side, we would have to look for another cause for her low iron. Pt voiced understanding and is agreeable to the IV iron and will call to get this scheduled. Advised that we will see her back in 4-6 weeks after the last IV iron to repeat her blood studies. Pt voiced understanding.  Dr. Leonides Schanz aware.

## 2024-01-29 NOTE — Telephone Encounter (Signed)
*  STAT* If patient is at the pharmacy, call can be transferred to refill team.   1. Which medications need to be refilled? (please list name of each medication and dose if known) ezetimibe (ZETIA) 10 MG tablet    2. Would you like to learn more about the convenience, safety, & potential cost savings by using the [facility]? No   3. Are you open to using the [facility] (Type [facility]. ). No   4. Which pharmacy/location (including street and city if local pharmacy) is medication to be sent to? [facility] - [place] ([place]), [place] - [address]    5. Do they need a 30 day or 90 day supply? 90 day   Pt out of this medication

## 2024-02-03 ENCOUNTER — Encounter: Payer: Self-pay | Admitting: Hematology and Oncology

## 2024-02-03 ENCOUNTER — Ambulatory Visit (INDEPENDENT_AMBULATORY_CARE_PROVIDER_SITE_OTHER)

## 2024-02-03 VITALS — BP 125/80 | HR 85 | Temp 98.4°F | Resp 16 | Ht 64.0 in | Wt 236.0 lb

## 2024-02-03 DIAGNOSIS — D500 Iron deficiency anemia secondary to blood loss (chronic): Secondary | ICD-10-CM

## 2024-02-03 DIAGNOSIS — D509 Iron deficiency anemia, unspecified: Secondary | ICD-10-CM | POA: Diagnosis not present

## 2024-02-03 MED ORDER — SODIUM CHLORIDE 0.9 % IV SOLN
510.0000 mg | Freq: Once | INTRAVENOUS | Status: AC
  Administered 2024-02-03: 510 mg via INTRAVENOUS
  Filled 2024-02-03: qty 17

## 2024-02-03 NOTE — Progress Notes (Signed)
 Diagnosis: Iron Deficiency Anemia  Provider:  Mannam, Praveen MD  Procedure: IV Infusion  IV Type: Peripheral, IV Location: L Hand  Feraheme (Ferumoxytol), Dose: 510 mg  Infusion Start Time: 1439 pm  Infusion Stop Time: 1502 pm  Post Infusion IV Care: Observation period completed and Peripheral IV Discontinued  Discharge: Condition: Good, Destination: Home . AVS Provided  Performed by:  Mayme Spearman, RN

## 2024-02-10 ENCOUNTER — Ambulatory Visit (INDEPENDENT_AMBULATORY_CARE_PROVIDER_SITE_OTHER)

## 2024-02-10 ENCOUNTER — Encounter: Payer: Self-pay | Admitting: Hematology and Oncology

## 2024-02-10 VITALS — BP 123/78 | HR 97 | Temp 98.2°F | Resp 18 | Ht 64.0 in | Wt 237.0 lb

## 2024-02-10 DIAGNOSIS — D500 Iron deficiency anemia secondary to blood loss (chronic): Secondary | ICD-10-CM

## 2024-02-10 DIAGNOSIS — D509 Iron deficiency anemia, unspecified: Secondary | ICD-10-CM

## 2024-02-10 MED ORDER — SODIUM CHLORIDE 0.9 % IV SOLN
510.0000 mg | Freq: Once | INTRAVENOUS | Status: AC
  Administered 2024-02-10: 510 mg via INTRAVENOUS
  Filled 2024-02-10: qty 17

## 2024-02-10 NOTE — Progress Notes (Signed)
 Diagnosis: Iron Deficiency Anemia  Provider:  Mannam, Praveen MD  Procedure: IV Infusion  IV Type: Peripheral, IV Location: L Hand  Feraheme (Ferumoxytol ), Dose: 510 mg  Infusion Start Time: 1438 pm  Infusion Stop Time: 1503 pm  Post Infusion IV Care: Observation period completed and Peripheral IV Discontinued  Discharge: Condition: Good, Destination: Home . AVS Provided  Performed by:  Mayme Spearman, RN

## 2024-05-24 NOTE — Progress Notes (Unsigned)
 Cardiology Office Note:    Date:  05/26/2024   ID:  Debra Bridges, Debra Bridges 04/04/1971, MRN [id number]  PCP:  Leva Rush, MD  Cardiologist:  None  Electrophysiologist:  None   Referring MD: Leva Rush, MD   Chief Complaint  Patient presents with   Coronary Artery Disease    History of Present Illness:    Debra Bridges is a 53 y.o. female with a hx of hypertension, hyperlipidemia who presents for follow-up.  He with referred by Dr. McComb for evaluation of elevated calcium score, initially seen 10/31/2022.  Calcium score 4 on 11/14/2021 (85th percentile).  Also with multiple small lung nodules.    Since last clinic visit, she reports she is doing well.  Denies any chest pain, dyspnea, lightheadedness, syncope, lower extremity edema, or palpitations.  Walks daily for 2 miles.    Past Medical History:  Diagnosis Date   Elevated LDL cholesterol level    Family history of cardiovascular disease    Hyperlipidemia    Hypertension    OSA (obstructive sleep apnea)    very mild obstructive sleep apnea with an AHI of 8.4/h and no evidence of nocturnal hypoxemia.    Past Surgical History:  Procedure Laterality Date   ABDOMINAL HYSTERECTOMY     CARPAL TUNNEL RELEASE     EXCISION OF SEBACEOUS CYST      Current Medications: Current Meds  Medication Sig   APPLE CIDER VINEGAR PO Take 480 mg by mouth in the morning and at bedtime.   betamethasone valerate (VALISONE) 0.1 % cream Apply 1 application topically daily. APPLY A THIN LAYER TO THE AFFECTED AREA ONCE DAILY   Cinnamon 500 MG capsule Take 500 mg by mouth daily.   ferrous sulfate 325 (65 FE) MG EC tablet Take 325 mg by mouth daily with breakfast.   fluticasone (FLONASE) 50 MCG/ACT nasal spray Place into both nostrils daily.   hydrochlorothiazide (MICROZIDE) 12.5 MG capsule Take 12.5 mg by mouth daily.   lisinopril (ZESTRIL) 10 MG tablet Take 10 mg by mouth daily.   meloxicam (MOBIC) 7.5 MG tablet Take 1 tablet by mouth daily.    metFORMIN (GLUCOPHAGE) 500 MG tablet Take 1 tablet by mouth 2 (two) times daily with a meal.   Omega-3 Fatty Acids (FISH OIL PO) Take by mouth.   omeprazole (PRILOSEC) 20 MG capsule Take 20 mg by mouth daily.   rosuvastatin (CRESTOR) 5 MG tablet Take 5 mg by mouth daily.   [DISCONTINUED] ezetimibe  (ZETIA ) 10 MG tablet Take 1 tablet (10 mg total) by mouth daily. Please call [phone] to schedule an overdue appointment for future refills. Thank you. 1st attempt.     Allergies:   Patient has no known allergies.   Social History   Socioeconomic History   Marital status: Media planner    Spouse name: Not on file   Number of children: Not on file   Years of education: Not on file   Highest education level: Not on file  Occupational History   Not on file  Tobacco Use   Smoking status: Never   Smokeless tobacco: Never  Substance and Sexual Activity   Alcohol use: Not on file   Drug use: Not on file   Sexual activity: Not on file  Other Topics Concern   Not on file  Social History Narrative   Not on file   Social Drivers of Health   Financial Resource Strain: Not on file  Food Insecurity: Not on file  Transportation Needs:  Not on file  Physical Activity: Not on file  Stress: Not on file  Social Connections: Not on file     Family History: The patient's family history includes Arthritis in her mother; Atrial fibrillation in her mother; Breast cancer in her paternal grandmother; Cancer in her paternal grandmother; Diabetes in her mother; Hypertension in her mother; Stroke in her mother.  ROS:   Please see the history of present illness.     All other systems reviewed and are negative.  EKGs/Labs/Other Studies Reviewed:    The following studies were reviewed today:   EKG:   10/31/2022: Sinus tachycardia, rate 109, no ST abnormalities 05/26/2024: Normal sinus rhythm, rate 76  Recent Labs: 12/18/2023: ALT 21; BUN 14; Creatinine 0.80; Hemoglobin 13.1; Platelet Count  265; Potassium 4.0; Sodium 140  Recent Lipid Panel    Component Value Date/Time   CHOL 163 10/31/2022 1515   TRIG 125 10/31/2022 1515   HDL 66 10/31/2022 1515   CHOLHDL 2.5 10/31/2022 1515   LDLCALC 75 10/31/2022 1515    Physical Exam:    VS:  BP 104/74   Pulse 76   Ht 5' 4 (1.626 m)   Wt 241 lb (109.3 kg)   SpO2 97%   BMI 41.37 kg/m     Wt Readings from Last 3 Encounters:  05/26/24 241 lb (109.3 kg)  02/10/24 237 lb (107.5 kg)  02/03/24 236 lb (107 kg)     GEN:  Well nourished, well developed in no acute distress HEENT: Normal NECK: No JVD; No carotid bruits LYMPHATICS: No lymphadenopathy CARDIAC: RRR, no murmurs, rubs, gallops RESPIRATORY:  Clear to auscultation without rales, wheezing or rhonchi  ABDOMEN: Soft, non-tender, non-distended MUSCULOSKELETAL:  No edema; No deformity  SKIN: Warm and dry NEUROLOGIC:  Alert and oriented x 3 PSYCHIATRIC:  Normal affect   ASSESSMENT:    1. Coronary artery disease involving native coronary artery of native heart without angina pectoris   2. Essential hypertension   3. Hyperlipidemia, unspecified hyperlipidemia type   4. Type 2 diabetes mellitus without complication, without long-term current use of insulin (HCC)     PLAN:    CAD: Calcium score 4 on 11/14/2021 (85th percentile).  No anginal symptoms - Continue rosuvastatin 5 mg daily and Zetia  10 mg daily.  LDL 42 on 08/12/2023  Hypertension: On lisinopril 10 mg daily and HCTZ 12.5 mg daily.  Appears controlled.  Check BMET, magnesium  Small lung nodules: Noted on calcium score.  Patient is low risk (no smoking history or history of malignancy), no follow-up needed  Hyperlipidemia: LDL 132 on 10/24/2021.  Started on rosuvastatin 5 mg daily and zetia  10 mg daily.  Unable to tolerate higher doses of statin due to myalgias.  LDL 42 on 08/12/2023.  Update lipid panel  T2DM: A1c 6.4% on 08/12/2023.  On metformin.  Check A1c  RTC in 1 year   Medication Adjustments/Labs  and Tests Ordered: Current medicines are reviewed at length with the patient today.  Concerns regarding medicines are outlined above.  Orders Placed This Encounter  Procedures   Basic Metabolic Panel (BMET)   Magnesium   Lipid panel   HgB A1c   EKG 12-Lead   Meds ordered this encounter  Medications   ezetimibe  (ZETIA ) 10 MG tablet    Sig: Take 1 tablet (10 mg total) by mouth daily. Please call [phone] to schedule an overdue appointment for future refills. Thank you. 1st attempt.    Dispense:  90 tablet    Refill:  3    Patient Instructions  Medication Instructions:  Continue current medications *If you need a refill on your cardiac medications before your next appointment, please call your pharmacy*  Lab Work: A1c, mg, Lipid PANEL, BMET If you have labs (blood work) drawn today and your tests are completely normal, you will receive your results only by: MyChart Message (if you have MyChart) OR A paper copy in the mail If you have any lab test that is abnormal or we need to change your treatment, we will call you to review the results.  Testing/Procedures: NONE  Follow-Up: At [facility], you and your health needs are our priority.  As part of our continuing mission to provide you with exceptional heart care, our providers are all part of one team.  This team includes your primary Cardiologist (physician) and Advanced Practice Providers or APPs (Physician Assistants and Nurse Practitioners) who all work together to provide you with the care you need, when you need it.  Your next appointment:   1 year(s)  Provider:   Dr. Kate  We recommend signing up for the patient portal called MyChart.  Sign up information is provided on this After Visit Summary.  MyChart is used to connect with patients for Virtual Visits (Telemedicine).  Patients are able to view lab/test results, encounter notes, upcoming appointments, etc.  Non-urgent messages can be sent to your  provider as well.   To learn more about what you can do with MyChart, go to [email or web address].   Other Instructions none       Signed, Debra LITTIE Kate, MD  05/26/2024 8:42 AM     [facility]

## 2024-05-26 ENCOUNTER — Encounter: Payer: Self-pay | Admitting: Cardiology

## 2024-05-26 ENCOUNTER — Ambulatory Visit: Attending: Cardiovascular Disease | Admitting: Cardiology

## 2024-05-26 VITALS — BP 104/74 | HR 76 | Ht 64.0 in | Wt 241.0 lb

## 2024-05-26 DIAGNOSIS — E119 Type 2 diabetes mellitus without complications: Secondary | ICD-10-CM

## 2024-05-26 DIAGNOSIS — I10 Essential (primary) hypertension: Secondary | ICD-10-CM

## 2024-05-26 DIAGNOSIS — E785 Hyperlipidemia, unspecified: Secondary | ICD-10-CM | POA: Diagnosis not present

## 2024-05-26 DIAGNOSIS — I2510 Atherosclerotic heart disease of native coronary artery without angina pectoris: Secondary | ICD-10-CM | POA: Diagnosis not present

## 2024-05-26 MED ORDER — EZETIMIBE 10 MG PO TABS: 10.0000 mg | ORAL_TABLET | Freq: Every day | ORAL | 3 refills | Status: AC

## 2024-05-26 NOTE — Patient Instructions (Signed)
 Medication Instructions:  Continue current medications *If you need a refill on your cardiac medications before your next appointment, please call your pharmacy*  Lab Work: A1c, mg, Lipid PANEL, BMET If you have labs (blood work) drawn today and your tests are completely normal, you will receive your results only by: MyChart Message (if you have MyChart) OR A paper copy in the mail If you have any lab test that is abnormal or we need to change your treatment, we will call you to review the results.  Testing/Procedures: NONE  Follow-Up: At [facility], you and your health needs are our priority.  As part of our continuing mission to provide you with exceptional heart care, our providers are all part of one team.  This team includes your primary Cardiologist (physician) and Advanced Practice Providers or APPs (Physician Assistants and Nurse Practitioners) who all work together to provide you with the care you need, when you need it.  Your next appointment:   1 year(s)  Provider:   Dr. Kate  We recommend signing up for the patient portal called MyChart.  Sign up information is provided on this After Visit Summary.  MyChart is used to connect with patients for Virtual Visits (Telemedicine).  Patients are able to view lab/test results, encounter notes, upcoming appointments, etc.  Non-urgent messages can be sent to your provider as well.   To learn more about what you can do with MyChart, go to [email or web address].   Other Instructions none
